# Patient Record
Sex: Female | Born: 1970 | Race: White | Hispanic: No | Marital: Married | Smoking: Current every day smoker
Health system: Southern US, Community
[De-identification: ages and names within clinical notes are randomized; demographics above are authoritative.]

## PROBLEM LIST (undated history)

## (undated) DIAGNOSIS — Z720 Tobacco use: Secondary | ICD-10-CM

## (undated) DIAGNOSIS — R0789 Other chest pain: Secondary | ICD-10-CM

## (undated) DIAGNOSIS — K219 Gastro-esophageal reflux disease without esophagitis: Secondary | ICD-10-CM

## (undated) DIAGNOSIS — E669 Obesity, unspecified: Secondary | ICD-10-CM

## (undated) HISTORY — PX: OTHER SURGICAL HISTORY: SHX169

## (undated) HISTORY — DX: Other chest pain: R07.89

## (undated) HISTORY — DX: Tobacco use: Z72.0

## (undated) HISTORY — DX: Gastro-esophageal reflux disease without esophagitis: K21.9

## (undated) HISTORY — DX: Obesity, unspecified: E66.9

---

## 2007-01-15 ENCOUNTER — Ambulatory Visit: Payer: Self-pay | Admitting: Pulmonary Disease

## 2012-10-26 DIAGNOSIS — R079 Chest pain, unspecified: Secondary | ICD-10-CM

## 2012-11-06 ENCOUNTER — Encounter: Payer: Self-pay | Admitting: Cardiology

## 2012-11-13 ENCOUNTER — Encounter: Payer: Self-pay | Admitting: Physician Assistant

## 2012-11-13 ENCOUNTER — Ambulatory Visit (INDEPENDENT_AMBULATORY_CARE_PROVIDER_SITE_OTHER): Payer: Self-pay | Admitting: Physician Assistant

## 2012-11-13 VITALS — BP 120/88 | HR 70 | Ht 66.0 in | Wt 224.8 lb

## 2012-11-13 DIAGNOSIS — Z720 Tobacco use: Secondary | ICD-10-CM | POA: Insufficient documentation

## 2012-11-13 DIAGNOSIS — F17200 Nicotine dependence, unspecified, uncomplicated: Secondary | ICD-10-CM

## 2012-11-13 DIAGNOSIS — R0789 Other chest pain: Secondary | ICD-10-CM

## 2012-11-13 NOTE — Assessment & Plan Note (Signed)
After a lengthy discussion about the possible etiology of her chest discomfort, we have agreed to proceed only with a baseline echocardiogram study. She does not believe that she needs a stress test, with which I concur. Therefore, if her echocardiogram is within NL limits, then no further cardiac workup is indicated and we will have her return to Dr. Diona Browner, on an as-needed basis.

## 2012-11-13 NOTE — Progress Notes (Signed)
Primary Cardiologist: Simona Huh, MD (new)   HPI: Post hospital followup from [facility], status post presentation with atypical chest pain.   Patient presented with no known history of CAD, and CRFs notable for long-standing tobacco smoking. Troponins NL. We recommended post hospital followup and consideration for outpatient GXT echocardiogram, for risk stratification.  She reports today only occasional episodes of CP, similar to her recent presentation. We spent quite a bit of time discussing the etiology of the symptoms, and she is convinced that it is most likely musculoskeletal. 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 At no time has she ever experience exertional CP or associated dyspnea.   Also of note, she has since stopped smoking tobacco.  No Known Allergies  Current Outpatient Prescriptions  Medication Sig Dispense Refill  . ALPRAZolam (XANAX) 0.25 MG tablet Take 0.25 mg by mouth 3 (three) times daily as needed.      Marland Kitchen ibuprofen (ADVIL,MOTRIN) 200 MG tablet Take 200 mg by mouth every 6 (six) hours as needed.        Past Medical History  Diagnosis Date  . Tobacco abuse   . Atypical chest pain   . GERD (gastroesophageal reflux disease)   . Obesity     Past Surgical History  Procedure Date  . Partial hysterectomy --- unknown   . Right shoulder surgery   . Right carpal tunnel surgery     History   Social History  . Marital Status: Married    Spouse Name: N/A    Number of Children: N/A  . Years of Education: N/A   Occupational History  . Not on file.   Social History Main Topics  . Smoking status: Former Smoker -- 0.5 packs/day for 24 years    Types: Cigarettes    Quit date: 10/31/2012  . Smokeless tobacco: Not on file  . Alcohol Use: Yes     Comment: on occasion  . Drug Use: No  . Sexually Active: Not on file   Other Topics Concern  . Not on file   Social History Narrative  . No  narrative on file    No family history on file.  ROS: no nausea, vomiting; no fever, chills; no melena, hematochezia; no claudication  PHYSICAL EXAM: BP 120/88  Pulse 70  Ht 5\' 6"  (1.676 m)  Wt 224 lb 12.8 oz (101.969 kg)  BMI 36.28 kg/m2  SpO2 99% GENERAL: 42 year old female, obese ; NAD HEENT: NCAT, PERRLA, EOMI; sclera clear; no xanthelasma NECK: palpable bilateral carotid pulses, no bruits; no JVD; no TM LUNGS: CTA bilaterally CARDIAC: RRR (S1, S2); no significant murmurs; no rubs or gallops ABDOMEN:  protuberant  EXTREMETIES: no significant peripheral edema SKIN: warm/dry; no obvious rash/lesions MUSCULOSKELETAL: no joint deformity NEURO: no focal deficit; NL affect   EKG:    ASSESSMENT & PLAN:  Atypical chest pain After a lengthy discussion about the possible etiology of her chest discomfort, we have agreed to proceed only with a baseline echocardiogram study. She does not believe that she needs a stress test, with which I concur. Therefore, if her echocardiogram is within NL limits, then no further cardiac workup is indicated and we will have her return to Dr. Diona Browner, on an as-needed basis.  Tobacco abuse She has since stopped smoking    Gene Paxtyn Wisdom, PAC

## 2012-11-13 NOTE — Assessment & Plan Note (Signed)
She has since stopped smoking

## 2012-11-13 NOTE — Patient Instructions (Addendum)
   Echo  Office will notify of results Continue all current medications. Follow up as needed

## 2012-11-26 ENCOUNTER — Other Ambulatory Visit (INDEPENDENT_AMBULATORY_CARE_PROVIDER_SITE_OTHER): Payer: Self-pay

## 2012-11-26 ENCOUNTER — Other Ambulatory Visit: Payer: Self-pay

## 2012-11-26 DIAGNOSIS — R0789 Other chest pain: Secondary | ICD-10-CM

## 2012-11-27 ENCOUNTER — Encounter: Payer: Self-pay | Admitting: *Deleted

## 2013-03-29 ENCOUNTER — Telehealth: Payer: Self-pay | Admitting: Nurse Practitioner

## 2013-03-29 ENCOUNTER — Ambulatory Visit (INDEPENDENT_AMBULATORY_CARE_PROVIDER_SITE_OTHER): Payer: BC Managed Care – PPO

## 2013-03-29 ENCOUNTER — Ambulatory Visit (INDEPENDENT_AMBULATORY_CARE_PROVIDER_SITE_OTHER): Payer: BC Managed Care – PPO | Admitting: Family Medicine

## 2013-03-29 ENCOUNTER — Encounter: Payer: Self-pay | Admitting: Family Medicine

## 2013-03-29 VITALS — BP 123/74 | HR 76 | Temp 98.4°F | Ht 67.0 in | Wt 220.0 lb

## 2013-03-29 DIAGNOSIS — M79672 Pain in left foot: Secondary | ICD-10-CM

## 2013-03-29 DIAGNOSIS — M79609 Pain in unspecified limb: Secondary | ICD-10-CM

## 2013-03-29 DIAGNOSIS — M7732 Calcaneal spur, left foot: Secondary | ICD-10-CM

## 2013-03-29 DIAGNOSIS — M7730 Calcaneal spur, unspecified foot: Secondary | ICD-10-CM

## 2013-03-29 MED ORDER — MELOXICAM 15 MG PO TABS: 15.0000 mg | ORAL_TABLET | Freq: Every day | ORAL | Status: DC

## 2013-03-29 NOTE — Telephone Encounter (Signed)
appt made

## 2013-03-29 NOTE — Progress Notes (Signed)
  Subjective:    Patient ID: Bryson Ha, female    DOB: Dec 22, 1970, 42 y.o.   MRN: [id number]  HPI Patient presents today with increased heel pain for a couple of weeks. This is on the bottom of her foot. 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   Review of Systems  Musculoskeletal: Positive for arthralgias (L heel pain and bottom of foot).       Objective:   Physical Exam Calcaneal tenderness left plantar heel area [facility] reading (PRIMARY) by  Dr.Moore: Left calcaneal spur                                    Assessment & Plan:  1. Left foot pain - DG Foot Complete Left; Future - meloxicam (MOBIC) 15 MG tablet; Take 1 tablet (15 mg total) by mouth daily.  Dispense: 30 tablet; Refill: 0  2. Calcaneal spur of left foot  Patient Instructions  Hold the ibuprofen Take prescribed medication as directed Use warm wet compresses to heel 20 minutes 3 or 4 times daily Check out [employer] or purchase good tennis shoe Off feet as much as possible for the next 5-7 days If problems continue may need injection by orthopedic surgeon   May take Tylenol if needed for additional pain  Nyra Capes MD

## 2013-03-29 NOTE — Patient Instructions (Signed)
Hold the ibuprofen Take prescribed medication as directed Use warm wet compresses to heel 20 minutes 3 or 4 times daily Check out [employer] or purchase good tennis shoe Off feet as much as possible for the next 5-7 days If problems continue may need injection by orthopedic surgeon

## 2013-04-05 ENCOUNTER — Telehealth: Payer: Self-pay | Admitting: *Deleted

## 2013-04-05 DIAGNOSIS — M79673 Pain in unspecified foot: Secondary | ICD-10-CM

## 2013-04-05 DIAGNOSIS — M25579 Pain in unspecified ankle and joints of unspecified foot: Secondary | ICD-10-CM

## 2013-04-05 NOTE — Telephone Encounter (Signed)
amb ortho referral- initiated - per dr Christell Constant- pt aware

## 2013-07-12 ENCOUNTER — Encounter: Payer: Self-pay | Admitting: Family Medicine

## 2013-07-12 ENCOUNTER — Ambulatory Visit (INDEPENDENT_AMBULATORY_CARE_PROVIDER_SITE_OTHER): Payer: BC Managed Care – PPO | Admitting: Family Medicine

## 2013-07-12 VITALS — BP 137/92 | HR 63 | Temp 98.0°F | Ht 67.0 in | Wt 237.0 lb

## 2013-07-12 DIAGNOSIS — R5381 Other malaise: Secondary | ICD-10-CM

## 2013-07-12 DIAGNOSIS — F411 Generalized anxiety disorder: Secondary | ICD-10-CM

## 2013-07-12 LAB — POCT CBC
Granulocyte percent: 65.2 %G (ref 37–80)
HCT, POC: 44.2 % (ref 37.7–47.9)
Hemoglobin: 14.6 g/dL (ref 12.2–16.2)
Lymph, poc: 6.2 — AB (ref 0.6–3.4)
MCH, POC: 30.1 pg (ref 27–31.2)
MCV: 91.5 fL (ref 80–97)
Platelet Count, POC: 425 10*3/uL — AB (ref 142–424)
RBC: 4.8 M/uL (ref 4.04–5.48)

## 2013-07-12 MED ORDER — BUSPIRONE HCL 15 MG PO TABS: ORAL_TABLET | ORAL | Status: AC

## 2013-07-12 NOTE — Progress Notes (Signed)
Subjective:    Patient ID: Heather Braun, female    DOB: 1970-12-13, 42 y.o.   MRN: [id number]  HPI Patient here today for anxiety and possible add, hard to focus. Patient comes in today complaining of increased anxiety, fatigue, decreased ability to concentrate, increased OCD, and trouble focusing and concentrating. She gives a history of at least 2 cups of coffee per day, morning and night. She describes no problems sleeping. She presented to the emergency room in February with chest pressure and tightness and that workup was negative. She has Xanax at home but says she does not like to take it and has not taken any of that recently. She has taken BuSpar in the past and this did help. This problem with her anxiety and fatigue has been going on for years, has become worse recently causing her to be more upset and angry than usual.    Patient Active Problem List   Diagnosis Date Noted  . Tobacco abuse 11/13/2012  . Atypical chest pain    Outpatient Encounter Prescriptions as of 07/12/2013  Medication Sig Dispense Refill  . ibuprofen (ADVIL,MOTRIN) 200 MG tablet Take 200 mg by mouth every 6 (six) hours as needed.      . predniSONE (STERAPRED UNI-PAK) 10 MG tablet Take 10 mg by mouth daily.      . [DISCONTINUED] ALPRAZolam (XANAX) 0.25 MG tablet Take 0.25 mg by mouth 3 (three) times daily as needed.      . [DISCONTINUED] meloxicam (MOBIC) 15 MG tablet Take 1 tablet (15 mg total) by mouth daily.  30 tablet  0   No facility-administered encounter medications on file as of 07/12/2013.    Review of Systems  Constitutional: Negative.   HENT: Negative.   Eyes: Negative.   Respiratory: Negative.   Cardiovascular: Negative.   Gastrointestinal: Negative.   Endocrine: Negative.   Genitourinary: Negative.   Musculoskeletal: Negative.   Skin: Negative.   Allergic/Immunologic: Negative.   Neurological: Negative.   Hematological: Negative.   Psychiatric/Behavioral: Positive for agitation. The  patient is nervous/anxious.        Hard to focus at work.        Objective:   Physical Exam  Nursing note and vitals reviewed. Constitutional: She is oriented to person, place, and time. She appears well-developed and well-nourished. No distress.  Overweight, pleasant, she is wearing a boot on her left foot because of plantar fasciitis  HENT:  Head: Normocephalic and atraumatic.  Right Ear: External ear normal.  Left Ear: External ear normal.  Nose: Nose normal.  Mouth/Throat: Oropharynx is clear and moist. No oropharyngeal exudate.  Eyes: Conjunctivae and EOM are normal. Right eye exhibits no discharge. Left eye exhibits no discharge. No scleral icterus.  Neck: Normal range of motion. Neck supple. No thyromegaly present.  Cardiovascular: Normal rate, regular rhythm and normal heart sounds.  Exam reveals no gallop and no friction rub.   No murmur heard. At 60 per minute  Pulmonary/Chest: Effort normal and breath sounds normal. No respiratory distress. She has no wheezes. She has no rales.  Abdominal: Soft. Bowel sounds are normal. She exhibits no mass. There is no tenderness. There is no rebound and no guarding.  Musculoskeletal: Normal range of motion. She exhibits no edema and no tenderness.  Lymphadenopathy:    She has no cervical adenopathy.  Neurological: She is alert and oriented to person, place, and time. She has normal reflexes. No cranial nerve deficit.  Skin: Skin is warm and dry.  Psychiatric: She  has a normal mood and affect. Her behavior is normal. Judgment and thought content normal.   BP 137/92  Pulse 63  Temp(Src) 98 F (36.7 C) (Oral)  Ht 5\' 7"  (1.702 m)  Wt 237 lb (107.502 kg)  BMI 37.11 kg/m2        Assessment & Plan:   1. Anxiety state, unspecified   2. Other malaise and fatigue    Orders Placed This Encounter  Procedures  . Hepatic function panel  . BMP8+EGFR  . Thyroid Panel With TSH  . POCT CBC   Meds ordered this encounter  Medications   . predniSONE (STERAPRED UNI-PAK) 10 MG tablet    Sig: Take 10 mg by mouth daily.  . busPIRone (BUSPAR) 15 MG tablet    Sig: One half to one tablet 3 times daily if needed    Dispense:  45 tablet    Refill:  1   Nyra Capes MD

## 2013-07-12 NOTE — Patient Instructions (Addendum)
Continue current medications. Continue good therapeutic lifestyle changes.  Fall precautions discussed with patient. follow up as planned and earlier as needed.  Try to reduce caffeine intake We will schedule a followup visit with Gennette Pac to evaluate you for ADHD and possibly initiating medication for this.

## 2013-07-13 LAB — HEPATIC FUNCTION PANEL
ALT: 23 IU/L (ref 0–32)
AST: 10 IU/L (ref 0–40)
Albumin: 4.6 g/dL (ref 3.5–5.5)
Alkaline Phosphatase: 94 IU/L (ref 39–117)
Bilirubin, Direct: 0.08 mg/dL (ref 0.00–0.40)
Total Bilirubin: 0.2 mg/dL (ref 0.0–1.2)

## 2013-07-13 LAB — BMP8+EGFR
Calcium: 9.7 mg/dL (ref 8.7–10.2)
Chloride: 99 mmol/L (ref 97–108)
Creatinine, Ser: 0.71 mg/dL (ref 0.57–1.00)
GFR calc non Af Amer: 105 mL/min/{1.73_m2} (ref >59)
Glucose: 62 mg/dL — ABNORMAL LOW (ref 65–99)
Potassium: 4.1 mmol/L (ref 3.5–5.2)
Sodium: 140 mmol/L (ref 134–144)

## 2013-07-13 LAB — THYROID PANEL WITH TSH
Free Thyroxine Index: 2.9 (ref 1.2–4.9)
T3 Uptake Ratio: 27 % (ref 24–39)

## 2013-07-14 ENCOUNTER — Other Ambulatory Visit (INDEPENDENT_AMBULATORY_CARE_PROVIDER_SITE_OTHER): Payer: BC Managed Care – PPO

## 2013-07-14 DIAGNOSIS — R5381 Other malaise: Secondary | ICD-10-CM

## 2013-07-14 DIAGNOSIS — R7989 Other specified abnormal findings of blood chemistry: Secondary | ICD-10-CM

## 2013-07-14 LAB — POCT CBC
HCT, POC: 41.8 % (ref 37.7–47.9)
Hemoglobin: 14 g/dL (ref 12.2–16.2)
Lymph, poc: 7.9 — AB (ref 0.6–3.4)
MCH, POC: 31.3 pg — AB (ref 27–31.2)
MCHC: 33.6 g/dL (ref 31.8–35.4)
MPV: 8.2 fL (ref 0–99.8)
POC Granulocyte: 10.4 — AB (ref 2–6.9)
POC LYMPH PERCENT: 42.1 %L (ref 10–50)
Platelet Count, POC: 366 10*3/uL (ref 142–424)
RDW, POC: 13.3 %

## 2013-07-15 LAB — SEDIMENTATION RATE: Sed Rate: 4 mm/hr (ref 0–32)

## 2013-07-29 ENCOUNTER — Encounter: Payer: Self-pay | Admitting: Nurse Practitioner

## 2013-07-29 ENCOUNTER — Ambulatory Visit (INDEPENDENT_AMBULATORY_CARE_PROVIDER_SITE_OTHER): Payer: BC Managed Care – PPO | Admitting: Nurse Practitioner

## 2013-07-29 VITALS — BP 121/78 | HR 81 | Temp 99.6°F | Ht 67.0 in | Wt 245.0 lb

## 2013-07-29 DIAGNOSIS — E162 Hypoglycemia, unspecified: Secondary | ICD-10-CM

## 2013-07-29 DIAGNOSIS — D72829 Elevated white blood cell count, unspecified: Secondary | ICD-10-CM

## 2013-07-29 DIAGNOSIS — F909 Attention-deficit hyperactivity disorder, unspecified type: Secondary | ICD-10-CM

## 2013-07-29 LAB — POCT CBC
Granulocyte percent: 51.1 %G (ref 37–80)
HCT, POC: 40.4 % (ref 37.7–47.9)
Hemoglobin: 13.33 g/dL (ref 12.2–16.2)
MCH, POC: 30.3 pg (ref 27–31.2)
MCHC: 33 g/dL (ref 31.8–35.4)
MCV: 91.8 fL (ref 80–97)
POC LYMPH PERCENT: 42.5 %L (ref 10–50)
RBC: 4.4 M/uL (ref 4.04–5.48)

## 2013-07-29 MED ORDER — LISDEXAMFETAMINE DIMESYLATE 50 MG PO CAPS: 50.0000 mg | ORAL_CAPSULE | ORAL | Status: DC

## 2013-07-29 NOTE — Progress Notes (Signed)
  Subjective:    Patient ID: Heather Braun, female    DOB: 18-Mar-1971, 42 y.o.   MRN: [id number]  HPI Regular patient of Dr. Christell Constant- He sent her to me to be evaluated for Adult ADD- Her mom and sister are on ADD meds- She is having problems concentrating at work Gets easily distracted.Can't seem to complete tasks at work- will have several things going on at the sametime.    Review of Systems  Unable to perform ROS All other systems reviewed and are negative.       Objective:   Physical Exam  Constitutional: She appears well-developed and well-nourished.  Cardiovascular: Normal rate, regular rhythm, normal heart sounds and intact distal pulses.   Pulmonary/Chest: Effort normal and breath sounds normal.  Psychiatric: She has a normal mood and affect. Her behavior is normal. Judgment and thought content normal.  fidigity    BP 121/78  Pulse 81  Temp(Src) 99.6 F (37.6 C) (Oral)  Ht 5\' 7"  (1.702 m)  Wt 245 lb (111.131 kg)  BMI 38.36 kg/m2'      Assessment & Plan:   1. Adult ADHD    Meds ordered this encounter  Medications  . lisdexamfetamine (VYVANSE) 50 MG capsule    Sig: Take 1 capsule (50 mg total) by mouth every morning.    Dispense:  30 capsule    Refill:  0    Order Specific Question:  Supervising Provider    Answer:  Ernestina Penna [[id number]]   Orders Placed This Encounter  Procedures  . CMP14+EGFR  . POCT CBC     Stress management Follow up 1 month Mary-Margaret Daphine Deutscher, FNP

## 2013-07-29 NOTE — Patient Instructions (Signed)
Attention Deficit Hyperactivity Disorder Attention deficit hyperactivity disorder (ADHD) is a problem with behavior issues based on the way the brain functions (neurobehavioral disorder). It is a common reason for behavior and academic problems in school. CAUSES  The cause of ADHD is unknown in most cases. It may run in families. It sometimes can be associated with learning disabilities and other behavioral problems. SYMPTOMS  There are 3 types of ADHD. The 3 types and some of the symptoms include:  Inattentive  Gets bored or distracted easily.  Loses or forgets things. Forgets to hand in homework.  Has trouble organizing or completing tasks.  Difficulty staying on task.  An inability to organize daily tasks and school work.  Leaving projects, chores, or homework unfinished.  Trouble paying attention or responding to details. Careless mistakes.  Difficulty following directions. Often seems like is not listening.  Dislikes activities that require sustained attention (like chores or homework).  Hyperactive-impulsive  Feels like it is impossible to sit still or stay in a seat. Fidgeting with hands and feet.  Trouble waiting turn.  Talking too much or out of turn. Interruptive.  Speaks or acts impulsively.  Aggressive, disruptive behavior.  Constantly busy or on the go, noisy.  Combined  Has symptoms of both of the above. Often children with ADHD feel discouraged about themselves and with school. They often perform well below their abilities in school. These symptoms can cause problems in home, school, and in relationships with peers. As children get older, the excess motor activities can calm down, but the problems with paying attention and staying organized persist. Most children do not outgrow ADHD but with good treatment can learn to cope with the symptoms. DIAGNOSIS  When ADHD is suspected, the diagnosis should be made by professionals trained in ADHD.  Diagnosis will  include:  Ruling out other reasons for the child's behavior.  The caregivers will check with the child's school and check their medical records.  They will talk to teachers and parents.  Behavior rating scales for the child will be filled out by those dealing with the child on a daily basis. A diagnosis is made only after all information has been considered. TREATMENT  Treatment usually includes behavioral treatment often along with medicines. It may include stimulant medicines. The stimulant medicines decrease impulsivity and hyperactivity and increase attention. Other medicines used include antidepressants and certain blood pressure medicines. Most experts agree that treatment for ADHD should address all aspects of the child's functioning. Treatment should not be limited to the use of medicines alone. Treatment should include structured classroom management. The parents must receive education to address rewarding good behavior, discipline, and limit-setting. Tutoring or behavioral therapy or both should be available for the child. If untreated, the disorder can have long-term serious effects into adolescence and adulthood. HOME CARE INSTRUCTIONS   Often with ADHD there is a lot of frustration among the family in dealing with the illness. There is often blame and anger that is not warranted. This is a life long illness. There is no way to prevent ADHD. In many cases, because the problem affects the family as a whole, the entire family may need help. A therapist can help the family find better ways to handle the disruptive behaviors and promote change. If the child is young, most of the therapist's work is with the parents. Parents will learn techniques for coping with and improving their child's behavior. Sometimes only the child with the ADHD needs counseling. Your caregivers can help   you make these decisions.  Children with ADHD may need help in organizing. Some helpful tips include:  Keep  routines the same every day from wake-up time to bedtime. Schedule everything. This includes homework and playtime. This should include outdoor and indoor recreation. Keep the schedule on the refrigerator or a bulletin board where it is frequently seen. Mark schedule changes as far in advance as possible.  Have a place for everything and keep everything in its place. This includes clothing, backpacks, and school supplies.  Encourage writing down assignments and bringing home needed books.  Offer your child a well-balanced diet. Breakfast is especially important for school performance. Children should avoid drinks with caffeine including:  Soft drinks.  Coffee.  Tea.  However, some older children (adolescents) may find these drinks helpful in improving their attention.  Children with ADHD need consistent rules that they can understand and follow. If rules are followed, give small rewards. Children with ADHD often receive, and expect, criticism. Look for good behavior and praise it. Set realistic goals. Give clear instructions. Look for activities that can foster success and self-esteem. Make time for pleasant activities with your child. Give lots of affection.  Parents are their children's greatest advocates. Learn as much as possible about ADHD. This helps you become a stronger and better advocate for your child. It also helps you educate your child's teachers and instructors if they feel inadequate in these areas. Parent support groups are often helpful. A national group with local chapters is called CHADD (Children and Adults with Attention Deficit Hyperactivity Disorder). PROGNOSIS  There is no cure for ADHD. Children with the disorder seldom outgrow it. Many find adaptive ways to accommodate the ADHD as they mature. SEEK MEDICAL CARE IF:  Your child has repeated muscle twitches, cough or speech outbursts.  Your child has sleep problems.  Your child has a marked loss of  appetite.  Your child develops depression.  Your child has new or worsening behavioral problems.  Your child develops dizziness.  Your child has a racing heart.  Your child has stomach pains.  Your child develops headaches. Document Released: 09/13/2002 Document Revised: 12/16/2011 Document Reviewed: 04/25/2008 Exit[employer] Patient Information 2014 [employer].  

## 2013-07-30 LAB — CMP14+EGFR
ALT: 15 IU/L (ref 0–32)
AST: 10 IU/L (ref 0–40)
Albumin/Globulin Ratio: 2 (ref 1.1–2.5)
BUN/Creatinine Ratio: 9 (ref 9–23)
BUN: 6 mg/dL (ref 6–24)
GFR calc Af Amer: 126 mL/min/{1.73_m2} (ref >59)
GFR calc non Af Amer: 110 mL/min/{1.73_m2} (ref >59)
Potassium: 4.3 mmol/L (ref 3.5–5.2)
Sodium: 143 mmol/L (ref 134–144)
Total Bilirubin: <0.2 mg/dL (ref 0.0–1.2)

## 2013-08-19 ENCOUNTER — Ambulatory Visit: Payer: BC Managed Care – PPO | Admitting: Nurse Practitioner

## 2013-08-23 ENCOUNTER — Ambulatory Visit (INDEPENDENT_AMBULATORY_CARE_PROVIDER_SITE_OTHER): Payer: BC Managed Care – PPO | Admitting: Nurse Practitioner

## 2013-08-23 ENCOUNTER — Encounter: Payer: Self-pay | Admitting: Nurse Practitioner

## 2013-08-23 VITALS — BP 133/79 | HR 87 | Temp 97.9°F | Ht 67.0 in | Wt 242.0 lb

## 2013-08-23 DIAGNOSIS — F909 Attention-deficit hyperactivity disorder, unspecified type: Secondary | ICD-10-CM

## 2013-08-23 MED ORDER — LISDEXAMFETAMINE DIMESYLATE 50 MG PO CAPS: 50.0000 mg | ORAL_CAPSULE | ORAL | Status: AC

## 2013-08-23 MED ORDER — LISDEXAMFETAMINE DIMESYLATE 50 MG PO CAPS: 50.0000 mg | ORAL_CAPSULE | ORAL | Status: DC

## 2013-08-23 NOTE — Progress Notes (Signed)
  Subjective:    Patient ID: Heather Braun, female    DOB: [birth year], 42 y.o.   MRN: [id number]  HPI Patient here today for ADHD follow up- she was evaluated about 3 weeks ago and was started on vyvanse 50 mg daily- she says that she is doing well- able to concentrate better- work going better. She is able to get more work done then [employer] to- wants to remain on currrent med and dose- no side effects.     Review of Systems  All other systems reviewed and are negative.       Objective:   Physical Exam  Constitutional: She appears well-developed and well-nourished.  Cardiovascular: Normal rate, regular rhythm and normal heart sounds.   Pulmonary/Chest: Effort normal and breath sounds normal.  Skin: Skin is warm.  Psychiatric: She has a normal mood and affect. Her behavior is normal. Judgment and thought content normal.    BP 133/79  Pulse 87  Temp(Src) 97.9 F (36.6 C) (Oral)  Ht 5\' 7"  (1.702 m)  Wt 242 lb (109.77 kg)  BMI 37.89 kg/m2       Assessment & Plan:   1. Adult ADHD    Meds ordered this encounter  Medications  . lisdexamfetamine (VYVANSE) 50 MG capsule    Sig: Take 1 capsule (50 mg total) by mouth every morning.    Dispense:  30 capsule    Refill:  0    Order Specific Question:  Supervising Provider    Answer:  Ernestina Penna [[id number]]  . lisdexamfetamine (VYVANSE) 50 MG capsule    Sig: Take 1 capsule (50 mg total) by mouth every morning.    Dispense:  30 capsule    Refill:  0    Do  Not fill till 09/22/13    Order Specific Question:  Supervising Provider    Answer:  Ernestina Penna [[id number]]   Stress management Follow up in 2 months  Mary-Margaret Daphine Deutscher, FNP

## 2013-08-23 NOTE — Patient Instructions (Signed)
Stress Management Stress is a state of physical or mental tension that often results from changes in your life or normal routine. Some common causes of stress are:  Death of a loved one.  Injuries or severe illnesses.  Getting fired or changing jobs.  Moving into a new home. Other causes may be:  Sexual problems.  Business or financial losses.  Taking on a large debt.  Regular conflict with someone at home or at work.  Constant tiredness from lack of sleep. It is not just bad things that are stressful. It may be stressful to:  Win the lottery.  Get married.  Buy a new car. The amount of stress that can be easily tolerated varies from person to person. Changes generally cause stress, regardless of the types of change. Too much stress can affect your health. It may lead to physical or emotional problems. Too little stress (boredom) may also become stressful. SUGGESTIONS TO REDUCE STRESS:  Talk things over with your family and friends. It often is helpful to share your concerns and worries. If you feel your problem is serious, you may want to get help from a professional counselor.  Consider your problems one at a time instead of lumping them all together. Trying to take care of everything at once may seem impossible. List all the things you need to do and then start with the most important one. Set a goal to accomplish 2 or 3 things each day. If you expect to do too many in a single day you will naturally fail, causing you to feel even more stressed.  Do not use alcohol or drugs to relieve stress. Although you may feel better for a short time, they do not remove the problems that caused the stress. They can also be habit forming.  Exercise regularly - at least 3 times per week. Physical exercise can help to relieve that "uptight" feeling and will relax you.  The shortest distance between despair and hope is often a good night's sleep.  Go to bed and get up on time allowing  yourself time for appointments without being rushed.  Take a short "time-out" period from any stressful situation that occurs during the day. Close your eyes and take some deep breaths. Starting with the muscles in your face, tense them, hold it for a few seconds, then relax. Repeat this with the muscles in your neck, shoulders, hand, stomach, back and legs.  Take good care of yourself. Eat a balanced diet and get plenty of rest.  Schedule time for having fun. Take a break from your daily routine to relax. HOME CARE INSTRUCTIONS   Call if you feel overwhelmed by your problems and feel you can no longer manage them on your own.  Return immediately if you feel like hurting yourself or someone else. Document Released: 03/19/2001 Document Revised: 12/16/2011 Document Reviewed: 05/18/2013 [employer] Patient Information 2014 [employer].  

## 2013-08-25 ENCOUNTER — Telehealth: Payer: Self-pay | Admitting: Nurse Practitioner

## 2013-08-25 MED ORDER — ACYCLOVIR 5 % EX OINT: 1.0000 "application " | TOPICAL_OINTMENT | CUTANEOUS | Status: AC

## 2013-08-25 NOTE — Telephone Encounter (Signed)
rx for zovirax cream sent to pharmacy

## 2013-08-25 NOTE — Telephone Encounter (Signed)
Has a bad fever blister is using abreva wants something else called in it keeps getting bigger

## 2013-10-25 ENCOUNTER — Other Ambulatory Visit: Payer: Self-pay | Admitting: *Deleted

## 2013-10-25 MED ORDER — LISDEXAMFETAMINE DIMESYLATE 50 MG PO CAPS: 50.0000 mg | ORAL_CAPSULE | ORAL | Status: DC

## 2013-10-25 NOTE — Telephone Encounter (Signed)
Patient last seen in office on 08-23-13. Rx last filled on 09-22-13. Please advise. If approved please print and route to Pool B so nurse can call patient to pick up

## 2013-10-25 NOTE — Telephone Encounter (Signed)
PAtient aware 

## 2013-10-25 NOTE — Telephone Encounter (Signed)
rx ready for pickup 

## 2013-11-10 ENCOUNTER — Ambulatory Visit (INDEPENDENT_AMBULATORY_CARE_PROVIDER_SITE_OTHER): Payer: BC Managed Care – PPO | Admitting: General Practice

## 2013-11-10 ENCOUNTER — Encounter: Payer: Self-pay | Admitting: General Practice

## 2013-11-10 VITALS — BP 98/58 | HR 73 | Temp 97.9°F | Ht 67.0 in | Wt 234.5 lb

## 2013-11-10 DIAGNOSIS — L259 Unspecified contact dermatitis, unspecified cause: Secondary | ICD-10-CM

## 2013-11-10 DIAGNOSIS — L309 Dermatitis, unspecified: Secondary | ICD-10-CM

## 2013-11-10 MED ORDER — METHYLPREDNISOLONE ACETATE 80 MG/ML IJ SUSP
80.0000 mg | Freq: Once | INTRAMUSCULAR | Status: AC
  Administered 2013-11-10: 80 mg via INTRAMUSCULAR

## 2013-11-10 MED ORDER — PREDNISONE (PAK) 10 MG PO TABS: ORAL_TABLET | ORAL | Status: AC

## 2013-11-10 MED ORDER — TRIAMCINOLONE ACETONIDE 0.5 % EX CREA: 1.0000 "application " | TOPICAL_CREAM | Freq: Two times a day (BID) | CUTANEOUS | Status: AC

## 2013-11-10 NOTE — Progress Notes (Signed)
   Subjective:    Patient ID: Heather Braun, female    DOB: 1971/05/27, 43 y.o.   MRN: [id number]  Rash This is a new problem. The current episode started in the past 7 days. The problem is unchanged. The affected locations include the abdomen. The rash is characterized by redness and itchiness. She was exposed to nothing. Associated symptoms include coughing. Pertinent negatives include no congestion, fever, shortness of breath or vomiting. Past treatments include anti-itch cream. Her past medical history is significant for eczema. There is no history of allergies or asthma.      Review of Systems  Constitutional: Negative for fever.  HENT: Negative for congestion.   Respiratory: Positive for cough. Negative for shortness of breath.   Gastrointestinal: Negative for vomiting.  Skin: Positive for rash.       Objective:   Physical Exam  Constitutional: She is oriented to person, place, and time. She appears well-developed and well-nourished.  Cardiovascular: Normal rate, regular rhythm and normal heart sounds.   Pulmonary/Chest: Effort normal and breath sounds normal. No respiratory distress. She exhibits no tenderness.  Neurological: She is alert and oriented to person, place, and time.  Skin: Skin is warm and dry. Rash noted.  Dry, patchy skin colored rash noted to both sides of abdomen.   Psychiatric: She has a normal mood and affect.          Assessment & Plan:  1. Eczema - methylPREDNISolone acetate (DEPO-MEDROL) injection 80 mg; Inject 1 mL (80 mg total) into the muscle once. - predniSONE (STERAPRED UNI-PAK) 10 MG tablet; Take as directed. Start 11/11/13  Dispense: 21 tablet; Refill: 0 - triamcinolone cream (KENALOG) 0.5 %; Apply 1 application topically 2 (two) times daily.  Dispense: 30 g; Refill: 1 -patient eduction provided and discussed on eczema -RTO if symptoms worsen or unresolved Patient verbalized understanding Coralie KeensMae E. Ilai Hiller, FNP-C

## 2013-11-10 NOTE — Patient Instructions (Signed)
Eczema Eczema, also called atopic dermatitis, is a skin disorder that causes inflammation of the skin. It causes a red rash and dry, scaly skin. The skin becomes very itchy. Eczema is generally worse during the cooler winter months and often improves with the warmth of summer. Eczema usually starts showing signs in infancy. Some children outgrow eczema, but it may last through adulthood.  CAUSES  The exact cause of eczema is not known, but it appears to run in families. People with eczema often have a family history of eczema, allergies, asthma, or hay fever. Eczema is not contagious. Flare-ups of the condition may be caused by:   Contact with something you are sensitive or allergic to.   Stress. SIGNS AND SYMPTOMS  Dry, scaly skin.   Red, itchy rash.   Itchiness. This may occur before the skin rash and may be very intense.  DIAGNOSIS  The diagnosis of eczema is usually made based on symptoms and medical history. TREATMENT  Eczema cannot be cured, but symptoms usually can be controlled with treatment and other strategies. A treatment plan might include:  Controlling the itching and scratching.   Use over-the-counter antihistamines as directed for itching. This is especially useful at night when the itching tends to be worse.   Use over-the-counter steroid creams as directed for itching.   Avoid scratching. Scratching makes the rash and itching worse. It may also result in a skin infection (impetigo) due to a break in the skin caused by scratching.   Keeping the skin well moisturized with creams every day. This will seal in moisture and help prevent dryness. Lotions that contain alcohol and water should be avoided because they can dry the skin.   Limiting exposure to things that you are sensitive or allergic to (allergens).   Recognizing situations that cause stress.   Developing a plan to manage stress.  HOME CARE INSTRUCTIONS   Only take over-the-counter or  prescription medicines as directed by your health care provider.   Do not use anything on the skin without checking with your health care provider.   Keep baths or showers short (5 minutes) in warm (not hot) water. Use mild cleansers for bathing. These should be unscented. You may add nonperfumed bath oil to the bath water. It is best to avoid soap and bubble bath.   Immediately after a bath or shower, when the skin is still damp, apply a moisturizing ointment to the entire body. This ointment should be a petroleum ointment. This will seal in moisture and help prevent dryness. The thicker the ointment, the better. These should be unscented.   Keep fingernails cut short. Children with eczema may need to wear soft gloves or mittens at night after applying an ointment.   Dress in clothes made of cotton or cotton blends. Dress lightly, because heat increases itching.   A child with eczema should stay away from anyone with fever blisters or cold sores. The virus that causes fever blisters (herpes simplex) can cause a serious skin infection in children with eczema. SEEK MEDICAL CARE IF:   Your itching interferes with sleep.   Your rash gets worse or is not better within 1 week after starting treatment.   You see pus or soft yellow scabs in the rash area.   You have a fever.   You have a rash flare-up after contact with someone who has fever blisters.  Document Released: 09/20/2000 Document Revised: 07/14/2013 Document Reviewed: 04/26/2013 [employer] Patient Information 2014 [employer]  

## 2015-02-18 IMAGING — CR DG FOOT COMPLETE 3+V*L*
3 series · 3 of 3 positions shown · non-contrast
Comparison: None.

CLINICAL DATA: History of painful left foot.

LEFT FOOT - COMPLETE 3+ VIEW

[view not recorded (1 of 3)]
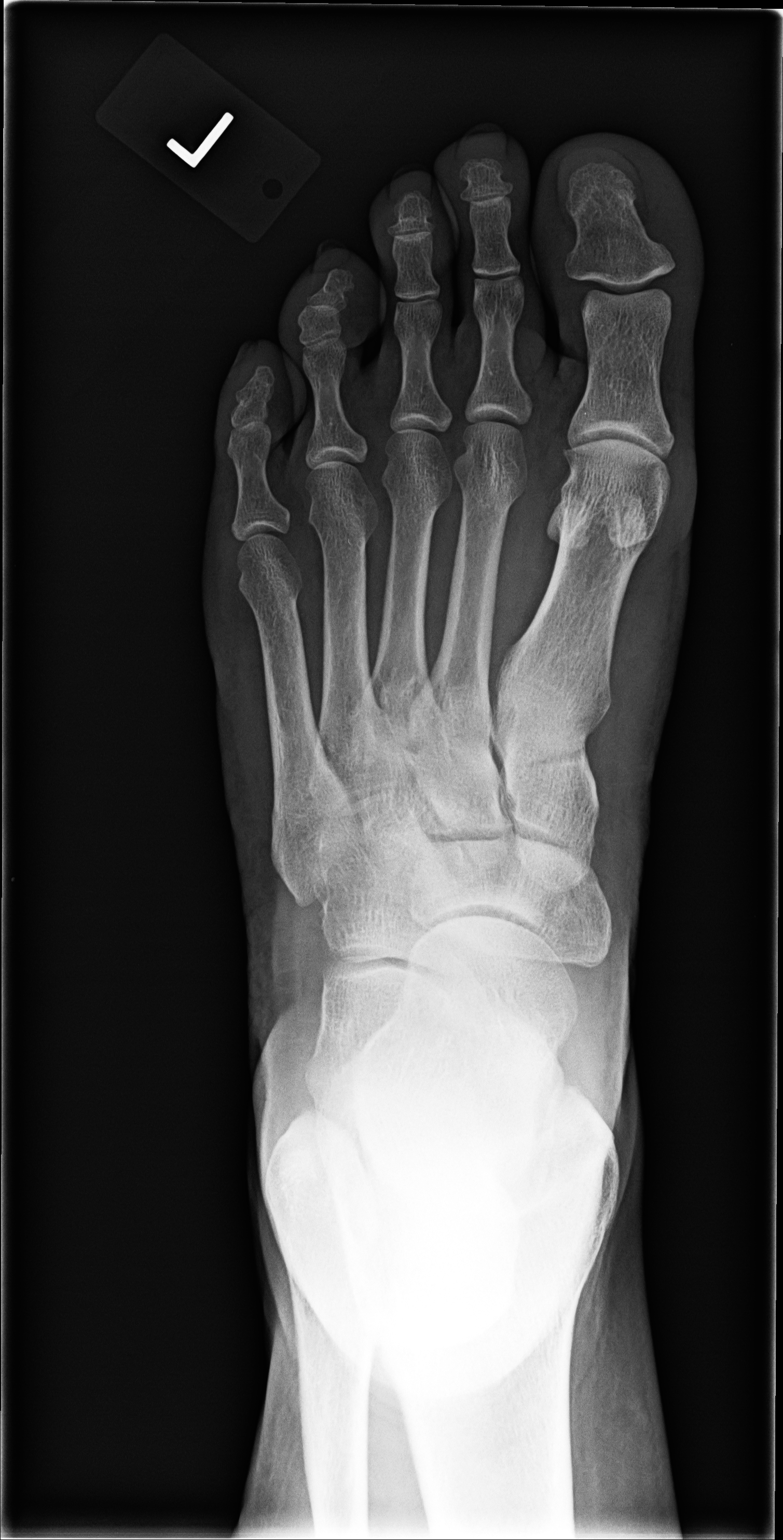

[view not recorded (2 of 3)]
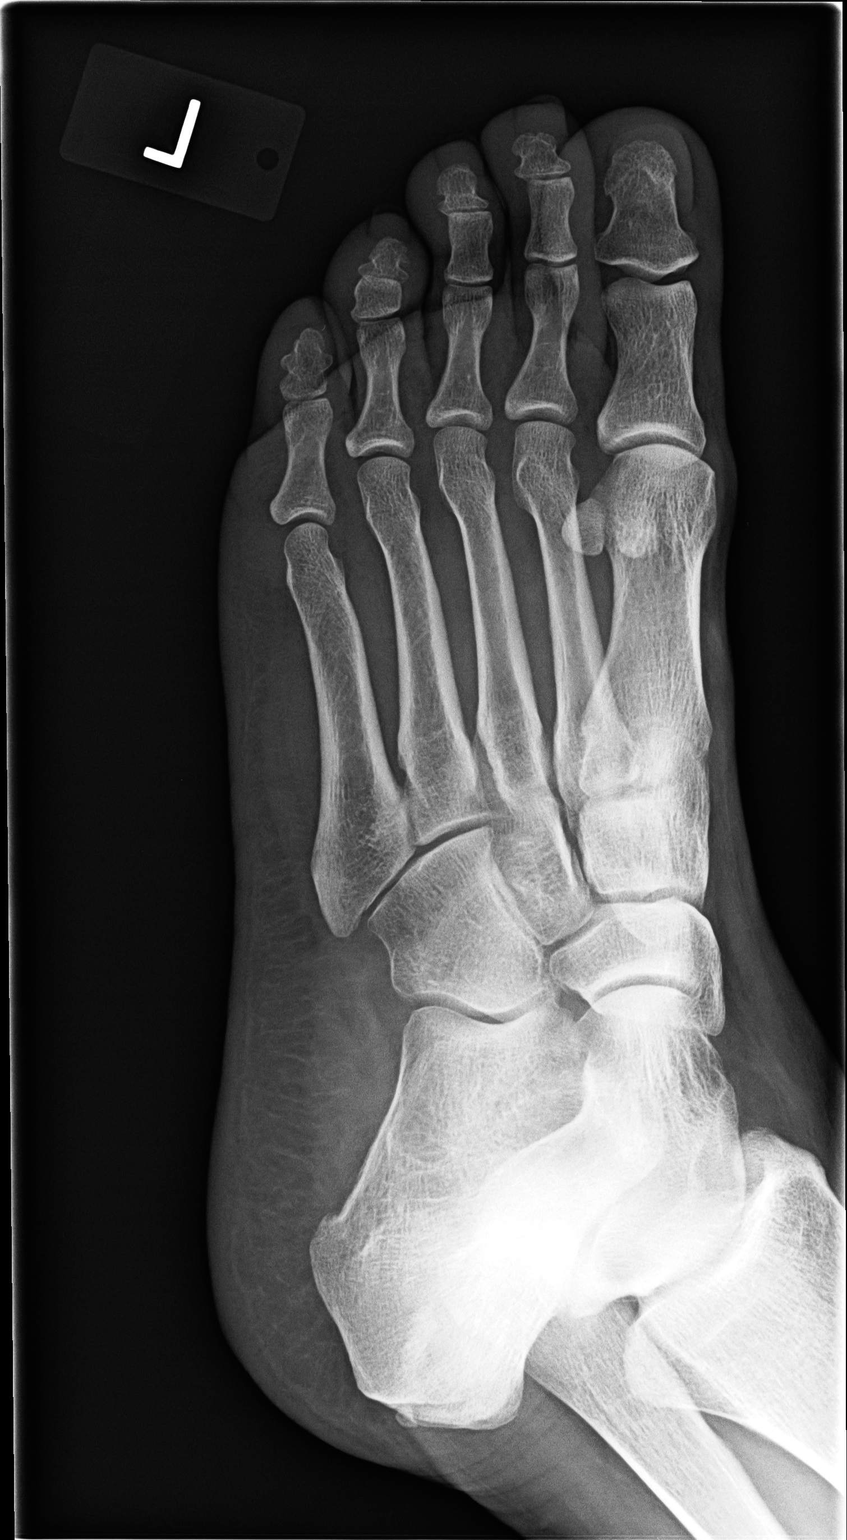

[view not recorded (3 of 3)]
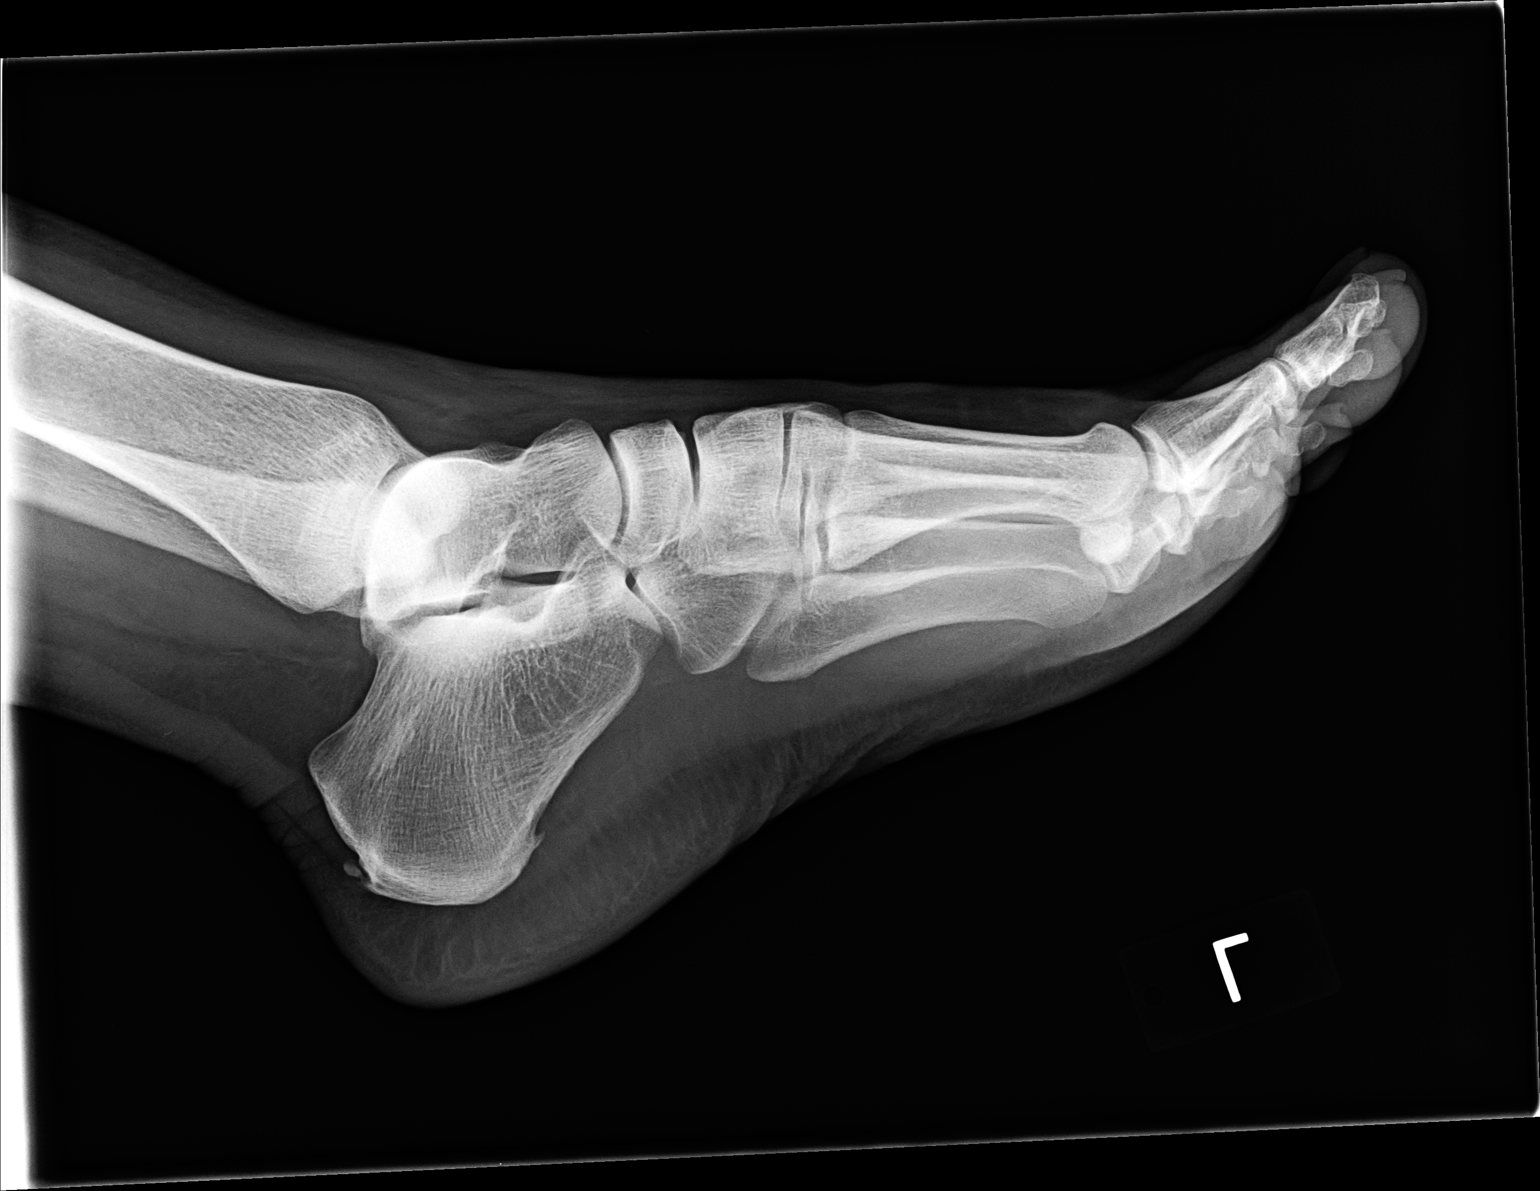

[3 of 3 positions shown; findings below may reference images not displayed]

FINDINGS: There is minimal degenerative spurring of the distal
medial aspect of the first metatarsal at the first MTP joint.
Posterior and plantar calcaneal spurring is present.  No fracture
or bony destruction is evident.
IMPRESSION: Degenerative spurring of the distal medial aspect of the first
metatarsal at first MTP joint.  Posterior and plantar calcaneal
spurring.

Clinically significant discrepancy from primary report, if
provided: None
